# Patient Record
Sex: Female | Born: 1945 | Race: White | Marital: Married
Health system: Southern US, Community
[De-identification: ages and names within clinical notes are randomized; demographics above are authoritative.]

## PROBLEM LIST (undated history)

## (undated) DIAGNOSIS — C73 Malignant neoplasm of thyroid gland: Secondary | ICD-10-CM

---

## 2003-05-16 HISTORY — PX: BREAST EXCISIONAL BIOPSY: SUR124

## 2011-01-23 ENCOUNTER — Other Ambulatory Visit (HOSPITAL_COMMUNITY): Payer: Self-pay | Admitting: Unknown Physician Specialty

## 2011-01-23 DIAGNOSIS — C73 Malignant neoplasm of thyroid gland: Secondary | ICD-10-CM

## 2011-01-27 ENCOUNTER — Encounter (HOSPITAL_COMMUNITY)
Admission: RE | Admit: 2011-01-27 | Discharge: 2011-01-27 | Disposition: A | Discharge status: Home / Self Care | Payer: Medicare Other | Source: Ambulatory Visit | Attending: Unknown Physician Specialty | Admitting: Unknown Physician Specialty

## 2011-01-27 DIAGNOSIS — C73 Malignant neoplasm of thyroid gland: Secondary | ICD-10-CM

## 2011-01-30 ENCOUNTER — Encounter (HOSPITAL_COMMUNITY)
Admission: RE | Admit: 2011-01-30 | Discharge: 2011-01-30 | Disposition: A | Discharge status: Home / Self Care | Payer: Medicare Other | Source: Ambulatory Visit | Attending: Unknown Physician Specialty | Admitting: Unknown Physician Specialty

## 2011-01-30 MED ORDER — SODIUM IODIDE I 131 CAPSULE
3.0000 | Freq: Once | INTRAVENOUS | Status: AC | PRN
  Administered 2011-01-30: 3 via ORAL

## 2011-02-06 ENCOUNTER — Other Ambulatory Visit (HOSPITAL_COMMUNITY): Payer: Self-pay | Admitting: Unknown Physician Specialty

## 2011-02-06 DIAGNOSIS — C73 Malignant neoplasm of thyroid gland: Secondary | ICD-10-CM

## 2011-02-08 ENCOUNTER — Encounter (HOSPITAL_COMMUNITY): Payer: Self-pay

## 2011-02-08 ENCOUNTER — Ambulatory Visit (HOSPITAL_COMMUNITY)
Admission: RE | Admit: 2011-02-08 | Discharge: 2011-02-08 | Disposition: A | Discharge status: Home / Self Care | Payer: Medicare Other | Source: Ambulatory Visit | Attending: Unknown Physician Specialty | Admitting: Unknown Physician Specialty

## 2011-02-08 DIAGNOSIS — C73 Malignant neoplasm of thyroid gland: Secondary | ICD-10-CM | POA: Insufficient documentation

## 2011-02-08 HISTORY — DX: Malignant neoplasm of thyroid gland: C73

## 2011-02-08 MED ORDER — SODIUM IODIDE I 131 CAPSULE
54.5000 | Freq: Once | INTRAVENOUS | Status: AC | PRN
  Administered 2011-02-08: 54.5 via ORAL

## 2014-02-09 ENCOUNTER — Other Ambulatory Visit: Payer: Self-pay

## 2014-02-09 DIAGNOSIS — R921 Mammographic calcification found on diagnostic imaging of breast: Secondary | ICD-10-CM

## 2014-02-10 ENCOUNTER — Other Ambulatory Visit: Payer: Self-pay | Admitting: Internal Medicine

## 2014-02-10 DIAGNOSIS — R921 Mammographic calcification found on diagnostic imaging of breast: Secondary | ICD-10-CM

## 2014-02-18 ENCOUNTER — Ambulatory Visit
Admission: RE | Admit: 2014-02-18 | Discharge: 2014-02-18 | Disposition: A | Discharge status: Home / Self Care | Payer: Medicare Other | Source: Ambulatory Visit | Attending: Internal Medicine | Admitting: Internal Medicine

## 2014-02-18 DIAGNOSIS — R921 Mammographic calcification found on diagnostic imaging of breast: Secondary | ICD-10-CM

## 2014-07-21 ENCOUNTER — Other Ambulatory Visit: Payer: Self-pay | Admitting: Internal Medicine

## 2014-07-21 ENCOUNTER — Other Ambulatory Visit: Payer: Self-pay

## 2014-07-21 DIAGNOSIS — R921 Mammographic calcification found on diagnostic imaging of breast: Secondary | ICD-10-CM

## 2014-08-21 ENCOUNTER — Ambulatory Visit
Admission: RE | Admit: 2014-08-21 | Discharge: 2014-08-21 | Disposition: A | Discharge status: Home / Self Care | Payer: Medicare Other | Source: Ambulatory Visit | Attending: Internal Medicine | Admitting: Internal Medicine

## 2014-08-21 DIAGNOSIS — R921 Mammographic calcification found on diagnostic imaging of breast: Secondary | ICD-10-CM

## 2015-07-12 ENCOUNTER — Other Ambulatory Visit: Payer: Self-pay

## 2015-07-12 DIAGNOSIS — Z1231 Encounter for screening mammogram for malignant neoplasm of breast: Secondary | ICD-10-CM

## 2015-08-23 ENCOUNTER — Ambulatory Visit
Admission: RE | Admit: 2015-08-23 | Discharge: 2015-08-23 | Disposition: A | Discharge status: Home / Self Care | Payer: Medicare Other | Source: Ambulatory Visit

## 2015-08-23 DIAGNOSIS — Z1231 Encounter for screening mammogram for malignant neoplasm of breast: Secondary | ICD-10-CM

## 2016-08-16 ENCOUNTER — Other Ambulatory Visit: Payer: Self-pay | Admitting: Internal Medicine

## 2016-08-16 DIAGNOSIS — Z1231 Encounter for screening mammogram for malignant neoplasm of breast: Secondary | ICD-10-CM

## 2016-09-04 ENCOUNTER — Other Ambulatory Visit: Payer: Self-pay | Admitting: Internal Medicine

## 2016-09-04 ENCOUNTER — Ambulatory Visit
Admission: RE | Admit: 2016-09-04 | Discharge: 2016-09-04 | Disposition: A | Discharge status: Home / Self Care | Payer: Medicare Other | Source: Ambulatory Visit | Attending: Internal Medicine | Admitting: Internal Medicine

## 2016-09-04 DIAGNOSIS — N63 Unspecified lump in unspecified breast: Secondary | ICD-10-CM

## 2016-09-04 DIAGNOSIS — N6320 Unspecified lump in the left breast, unspecified quadrant: Secondary | ICD-10-CM

## 2016-09-04 DIAGNOSIS — Z1231 Encounter for screening mammogram for malignant neoplasm of breast: Secondary | ICD-10-CM

## 2017-07-30 ENCOUNTER — Other Ambulatory Visit: Payer: Self-pay | Admitting: Internal Medicine

## 2017-07-30 DIAGNOSIS — Z1231 Encounter for screening mammogram for malignant neoplasm of breast: Secondary | ICD-10-CM

## 2017-09-05 ENCOUNTER — Ambulatory Visit
Admission: RE | Admit: 2017-09-05 | Discharge: 2017-09-05 | Disposition: A | Discharge status: Home / Self Care | Payer: Medicare Other | Source: Ambulatory Visit | Attending: Internal Medicine | Admitting: Internal Medicine

## 2017-09-05 ENCOUNTER — Encounter: Payer: Self-pay | Admitting: Radiology

## 2017-09-05 DIAGNOSIS — Z1231 Encounter for screening mammogram for malignant neoplasm of breast: Secondary | ICD-10-CM

## 2018-07-26 ENCOUNTER — Other Ambulatory Visit: Payer: Self-pay | Admitting: Internal Medicine

## 2018-07-26 DIAGNOSIS — Z1231 Encounter for screening mammogram for malignant neoplasm of breast: Secondary | ICD-10-CM

## 2018-09-09 ENCOUNTER — Ambulatory Visit: Payer: Medicare Other

## 2018-10-24 ENCOUNTER — Ambulatory Visit
Admission: RE | Admit: 2018-10-24 | Discharge: 2018-10-24 | Disposition: A | Discharge status: Home / Self Care | Payer: Medicare Other | Source: Ambulatory Visit | Attending: Internal Medicine | Admitting: Internal Medicine

## 2018-10-24 ENCOUNTER — Other Ambulatory Visit: Payer: Self-pay

## 2018-10-24 DIAGNOSIS — Z1231 Encounter for screening mammogram for malignant neoplasm of breast: Secondary | ICD-10-CM

## 2019-09-18 ENCOUNTER — Other Ambulatory Visit: Payer: Self-pay | Admitting: Internal Medicine

## 2019-09-18 DIAGNOSIS — Z1231 Encounter for screening mammogram for malignant neoplasm of breast: Secondary | ICD-10-CM

## 2019-10-27 ENCOUNTER — Ambulatory Visit
Admission: RE | Admit: 2019-10-27 | Discharge: 2019-10-27 | Disposition: A | Discharge status: Home / Self Care | Payer: Medicare Other | Source: Ambulatory Visit | Attending: Internal Medicine | Admitting: Internal Medicine

## 2019-10-27 ENCOUNTER — Other Ambulatory Visit: Payer: Self-pay

## 2019-10-27 DIAGNOSIS — Z1231 Encounter for screening mammogram for malignant neoplasm of breast: Secondary | ICD-10-CM

## 2020-09-23 ENCOUNTER — Other Ambulatory Visit: Payer: Self-pay | Admitting: Internal Medicine

## 2020-09-23 DIAGNOSIS — Z1231 Encounter for screening mammogram for malignant neoplasm of breast: Secondary | ICD-10-CM

## 2020-11-25 ENCOUNTER — Ambulatory Visit
Admission: RE | Admit: 2020-11-25 | Discharge: 2020-11-25 | Disposition: A | Discharge status: Home / Self Care | Payer: Medicare Other | Source: Ambulatory Visit | Attending: Internal Medicine | Admitting: Internal Medicine

## 2020-11-25 ENCOUNTER — Other Ambulatory Visit: Payer: Self-pay

## 2020-11-25 DIAGNOSIS — Z1231 Encounter for screening mammogram for malignant neoplasm of breast: Secondary | ICD-10-CM

## 2021-10-24 ENCOUNTER — Other Ambulatory Visit: Payer: Self-pay | Admitting: Internal Medicine

## 2021-10-24 DIAGNOSIS — Z1231 Encounter for screening mammogram for malignant neoplasm of breast: Secondary | ICD-10-CM

## 2021-12-06 ENCOUNTER — Ambulatory Visit
Admission: RE | Admit: 2021-12-06 | Discharge: 2021-12-06 | Disposition: A | Discharge status: Home / Self Care | Payer: Medicare Other | Source: Ambulatory Visit | Attending: Internal Medicine | Admitting: Internal Medicine

## 2021-12-06 DIAGNOSIS — Z1231 Encounter for screening mammogram for malignant neoplasm of breast: Secondary | ICD-10-CM

## 2022-01-24 IMAGING — MG MM DIGITAL SCREENING BILAT W/ TOMO AND CAD
6 of 10 series · 6 of 30 positions shown · non-contrast
Comparison: Previous exam(s).

CLINICAL DATA: Screening.

EXAM:
DIGITAL SCREENING BILATERAL MAMMOGRAM WITH TOMOSYNTHESIS AND CAD
TECHNIQUE: Bilateral screening digital craniocaudal and mediolateral oblique
mammograms were obtained. Bilateral screening digital breast
tomosynthesis was performed. The images were evaluated with
computer-aided detection.

[R CC synth-2D]
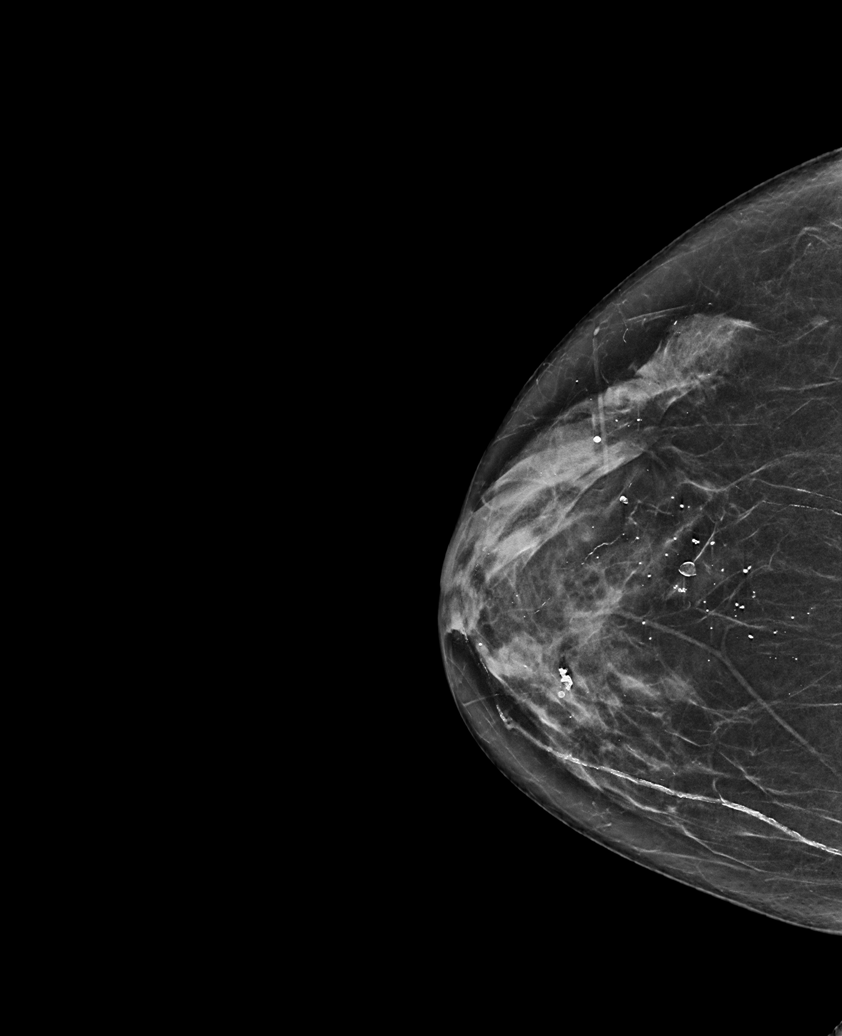

[L MLO synth-2D (1 of 2)]
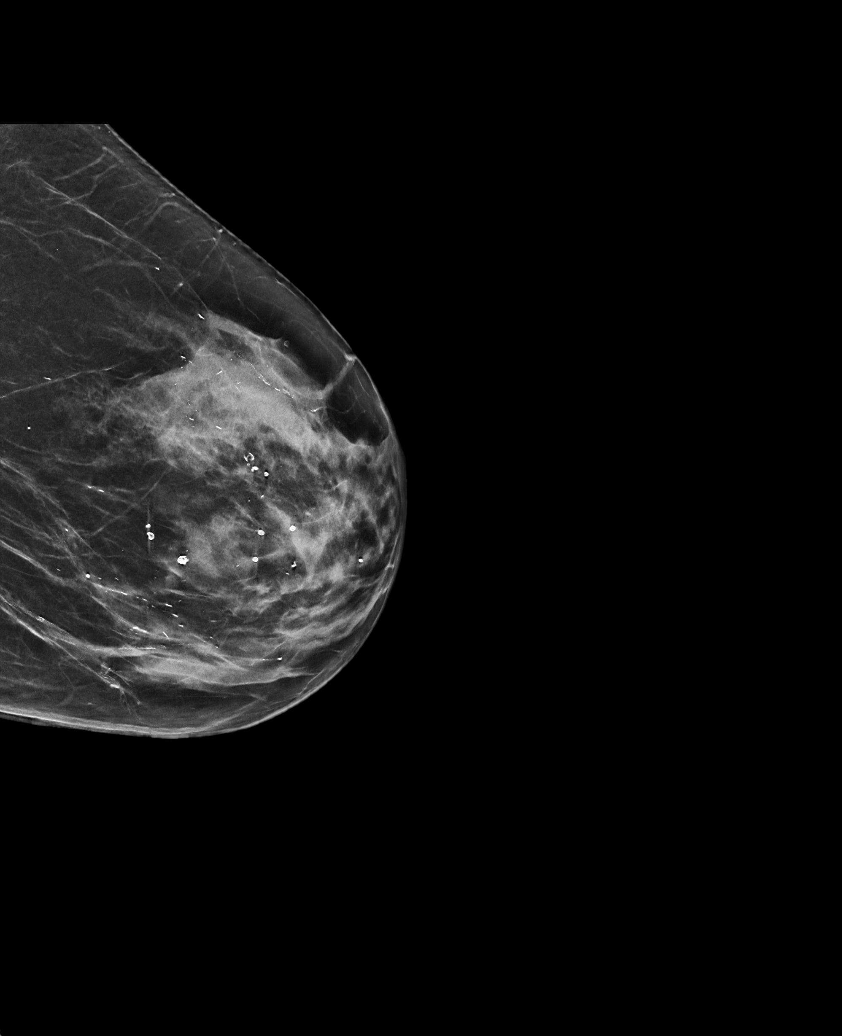

[L CC synth-2D]
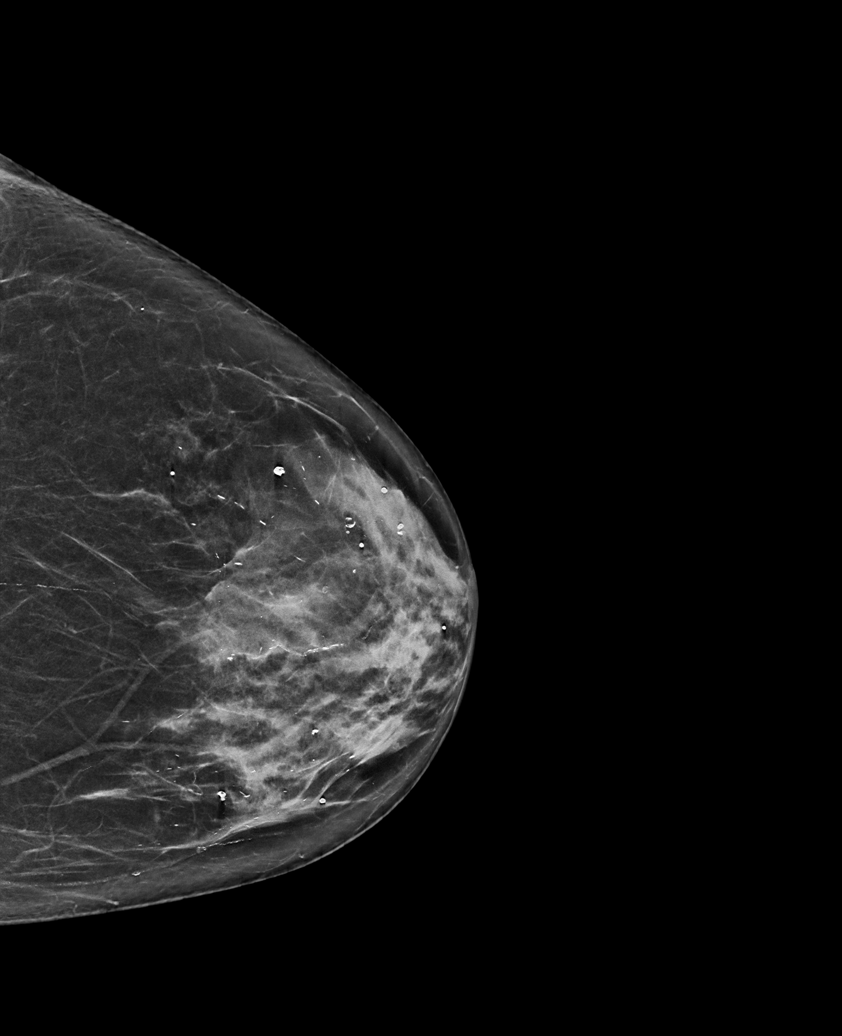

[R MLO synth-2D]
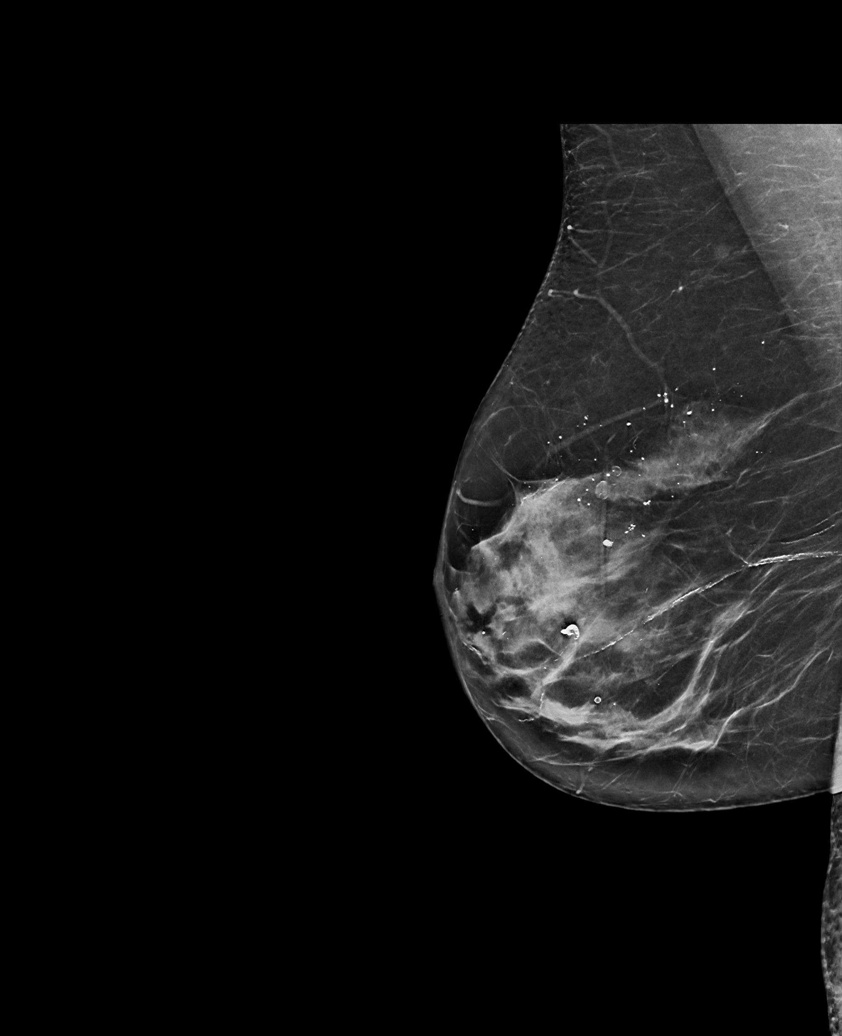

[L MLO synth-2D (2 of 2)]
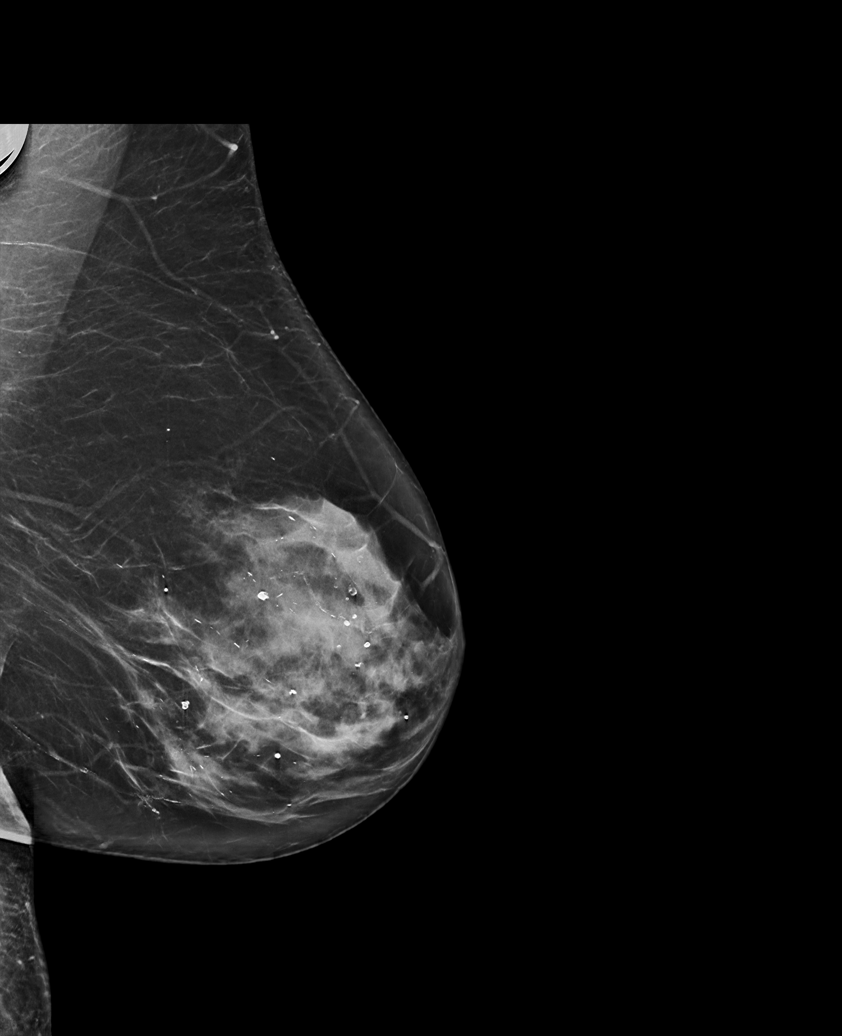

[L MLO tomo · tomo slice 39/78.0]
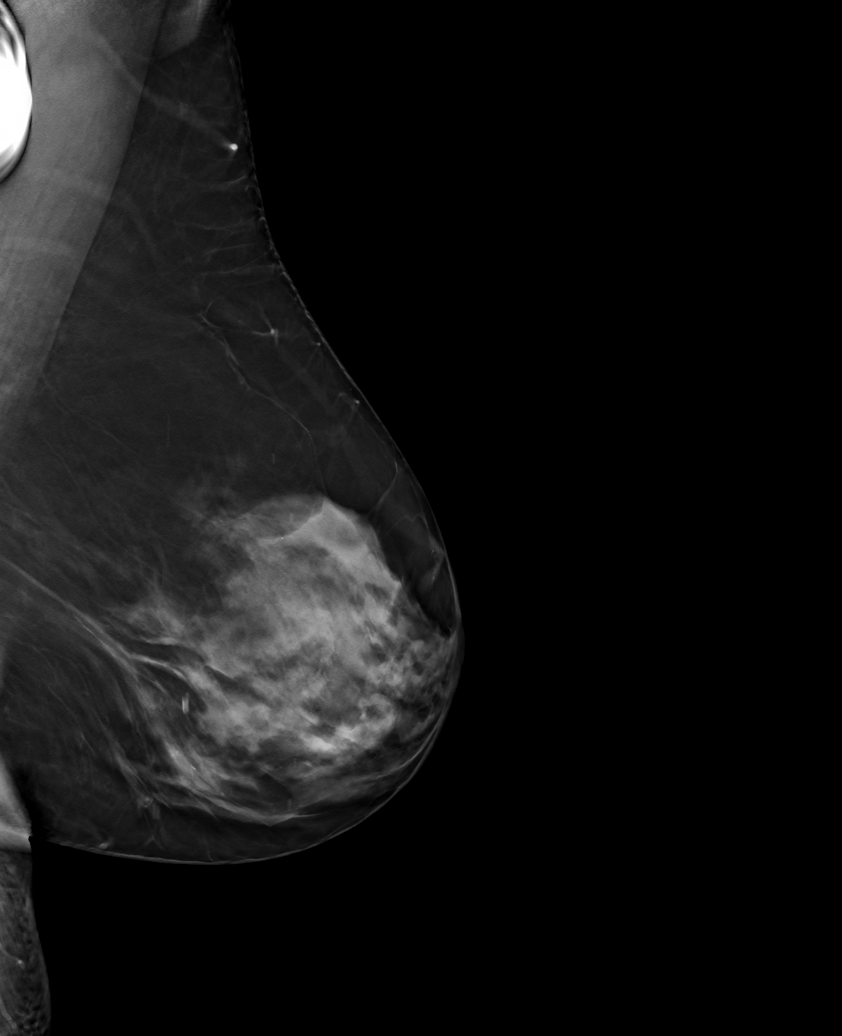

[6 of 30 positions shown; findings below may reference images not displayed]

ACR Breast Density Category d: The breast tissue is extremely dense,
which lowers the sensitivity of mammography
FINDINGS: There are no findings suspicious for malignancy.
IMPRESSION: No mammographic evidence of malignancy. A result letter of this
screening mammogram will be mailed directly to the patient.

RECOMMENDATION:
Screening mammogram in one year. (Code:[id number])

BI-RADS CATEGORY  1: Negative.

## 2022-11-03 ENCOUNTER — Other Ambulatory Visit: Payer: Self-pay | Admitting: Internal Medicine

## 2022-11-03 DIAGNOSIS — Z1231 Encounter for screening mammogram for malignant neoplasm of breast: Secondary | ICD-10-CM

## 2022-12-08 ENCOUNTER — Ambulatory Visit
Admission: RE | Admit: 2022-12-08 | Discharge: 2022-12-08 | Disposition: A | Discharge status: Home / Self Care | Payer: Medicare Other | Source: Ambulatory Visit | Attending: Internal Medicine | Admitting: Internal Medicine

## 2022-12-08 DIAGNOSIS — Z1231 Encounter for screening mammogram for malignant neoplasm of breast: Secondary | ICD-10-CM

## 2023-11-07 ENCOUNTER — Other Ambulatory Visit: Payer: Self-pay | Admitting: Internal Medicine

## 2023-11-07 DIAGNOSIS — Z1231 Encounter for screening mammogram for malignant neoplasm of breast: Secondary | ICD-10-CM

## 2023-12-11 ENCOUNTER — Ambulatory Visit
Admission: RE | Admit: 2023-12-11 | Discharge: 2023-12-11 | Disposition: A | Discharge status: Home / Self Care | Source: Ambulatory Visit | Attending: Internal Medicine | Admitting: Internal Medicine

## 2023-12-11 DIAGNOSIS — Z1231 Encounter for screening mammogram for malignant neoplasm of breast: Secondary | ICD-10-CM

## 2024-03-05 ENCOUNTER — Other Ambulatory Visit: Payer: Self-pay | Admitting: Sports Medicine

## 2024-03-05 DIAGNOSIS — M25551 Pain in right hip: Secondary | ICD-10-CM

## 2024-03-10 ENCOUNTER — Ambulatory Visit
Admission: RE | Admit: 2024-03-10 | Discharge: 2024-03-10 | Disposition: A | Source: Ambulatory Visit | Attending: Sports Medicine | Admitting: Sports Medicine

## 2024-03-10 DIAGNOSIS — M25551 Pain in right hip: Secondary | ICD-10-CM
